# Patient Record
Sex: Female | Born: 1980 | Race: Black or African American | Hispanic: No | Marital: Single | Smoking: Current every day smoker
Health system: Southern US, Community
[De-identification: ages and names within clinical notes are randomized; demographics above are authoritative.]

## PROBLEM LIST (undated history)

## (undated) HISTORY — PX: DILATION AND CURETTAGE OF UTERUS: SHX78

---

## 2015-08-02 ENCOUNTER — Emergency Department
Admission: EM | Admit: 2015-08-02 | Discharge: 2015-08-02 | Disposition: A | Discharge status: Home / Self Care | Payer: No Typology Code available for payment source | Attending: Emergency Medicine | Admitting: Emergency Medicine

## 2015-08-02 ENCOUNTER — Emergency Department: Payer: No Typology Code available for payment source

## 2015-08-02 ENCOUNTER — Encounter: Payer: Self-pay | Admitting: Emergency Medicine

## 2015-08-02 DIAGNOSIS — Z720 Tobacco use: Secondary | ICD-10-CM | POA: Diagnosis not present

## 2015-08-02 DIAGNOSIS — S5012XA Contusion of left forearm, initial encounter: Secondary | ICD-10-CM

## 2015-08-02 DIAGNOSIS — Y998 Other external cause status: Secondary | ICD-10-CM | POA: Diagnosis not present

## 2015-08-02 DIAGNOSIS — S20212A Contusion of left front wall of thorax, initial encounter: Secondary | ICD-10-CM | POA: Diagnosis not present

## 2015-08-02 DIAGNOSIS — S50812A Abrasion of left forearm, initial encounter: Secondary | ICD-10-CM

## 2015-08-02 DIAGNOSIS — S299XXA Unspecified injury of thorax, initial encounter: Secondary | ICD-10-CM | POA: Diagnosis present

## 2015-08-02 DIAGNOSIS — Y92410 Unspecified street and highway as the place of occurrence of the external cause: Secondary | ICD-10-CM | POA: Insufficient documentation

## 2015-08-02 DIAGNOSIS — Y9389 Activity, other specified: Secondary | ICD-10-CM | POA: Insufficient documentation

## 2015-08-02 MED ORDER — CYCLOBENZAPRINE HCL 10 MG PO TABS: 10.0000 mg | ORAL_TABLET | Freq: Three times a day (TID) | ORAL | Status: DC | PRN

## 2015-08-02 MED ORDER — IBUPROFEN 800 MG PO TABS: 800.0000 mg | ORAL_TABLET | Freq: Three times a day (TID) | ORAL | Status: DC | PRN
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
## 2018-09-17 ENCOUNTER — Emergency Department
Admission: EM | Admit: 2018-09-17 | Discharge: 2018-09-17 | Disposition: A | Discharge status: Home / Self Care | Payer: Self-pay | Attending: Emergency Medicine | Admitting: Emergency Medicine

## 2018-09-17 ENCOUNTER — Encounter: Payer: Self-pay | Admitting: Emergency Medicine

## 2018-09-17 ENCOUNTER — Other Ambulatory Visit: Payer: Self-pay

## 2018-09-17 DIAGNOSIS — N3000 Acute cystitis without hematuria: Secondary | ICD-10-CM | POA: Insufficient documentation

## 2018-09-17 DIAGNOSIS — F17210 Nicotine dependence, cigarettes, uncomplicated: Secondary | ICD-10-CM | POA: Insufficient documentation

## 2018-09-17 LAB — URINALYSIS, COMPLETE (UACMP) WITH MICROSCOPIC
Bacteria, UA: NONE SEEN
Bilirubin Urine: NEGATIVE
GLUCOSE, UA: NEGATIVE mg/dL
HGB URINE DIPSTICK: NEGATIVE
Ketones, ur: NEGATIVE mg/dL
NITRITE: NEGATIVE
Protein, ur: NEGATIVE mg/dL
Specific Gravity, Urine: 1.025 (ref 1.005–1.030)
WBC, UA: >50 WBC/hpf — ABNORMAL HIGH (ref 0–5)
pH: 6 (ref 5.0–8.0)

## 2018-09-17 LAB — POCT PREGNANCY, URINE: PREG TEST UR: NEGATIVE

## 2018-09-17 MED ORDER — CEPHALEXIN 500 MG PO CAPS: 500.0000 mg | ORAL_CAPSULE | Freq: Three times a day (TID) | ORAL | 0 refills | Status: AC

## 2018-09-17 NOTE — ED Triage Notes (Signed)
Pt to ED c/o nasal congestion x3 days and also burning with urination x1 month.

## 2018-09-17 NOTE — ED Notes (Signed)
Pt with 5/10 burning with urination. No other complaints.

## 2018-09-17 NOTE — ED Provider Notes (Signed)
[facility] Emergency Department Provider Note  ____________________________________________  Time seen: Approximately 4:18 PM  I have reviewed the triage vital signs and the nursing notes.   HISTORY  Chief Complaint Urinary Tract Infection and Nasal Congestion    HPI Savannah Hughes is a 37 y.o. female presents to the emergency department with 1 month of dysuria without increased urinary frequency, hematuria or low back pain.  Patient reports that she has never had a urinary tract infection in the past.  Patient reports that she is not currently sexually active and has no concerns for STDs.  No changes in vaginal discharge.  Patient denies a prior history of nephrolithiasis.  Patient reports that she is scared of coming to hospitals and that is why she waited so long before seeking treatment.  No fever at home.  No nausea or vomiting.  Patient secondarily reports some intermittent nasal congestion.   History reviewed. No pertinent past medical history.  There are no active problems to display for this patient.   History reviewed. No pertinent surgical history.  Prior to Admission medications   Medication Sig Start Date End Date Taking? Authorizing Provider  cephALEXin (KEFLEX) 500 MG capsule Take 1 capsule (500 mg total) by mouth 3 (three) times daily for 7 days. 09/17/18 09/24/18  Orvil Feil, PA-C  cyclobenzaprine (FLEXERIL) 10 MG tablet Take 1 tablet (10 mg total) by mouth every 8 (eight) hours as needed for muscle spasms. 08/02/15   Joni Reining, PA-C  ibuprofen (ADVIL,MOTRIN) 800 MG tablet Take 1 tablet (800 mg total) by mouth every 8 (eight) hours as needed for moderate pain. 08/02/15   Joni Reining, PA-C    Allergies Patient has no known allergies.  History reviewed. No pertinent family history.  Social History Social History   Tobacco Use  . Smoking status: Current Every Day Smoker    Packs/day: 1.00    Types: Cigarettes  .  Smokeless tobacco: Never Used  Substance Use Topics  . Alcohol use: Yes    Frequency: Never    Comment: 4-5 25oz beers per day  . Drug use: Yes    Types: Marijuana     Review of Systems  Constitutional: No fever/chills Eyes: No visual changes. No discharge ENT: No upper respiratory complaints. Cardiovascular: no chest pain. Respiratory: no cough. No SOB. Gastrointestinal: No abdominal pain.  No nausea, no vomiting.  No diarrhea.  No constipation. Genitourinary: Patient has dysuria. No hematuria Musculoskeletal: Negative for musculoskeletal pain. Skin: Negative for rash, abrasions, lacerations, ecchymosis. Neurological: Negative for headaches, focal weakness or numbness.   ____________________________________________   PHYSICAL EXAM:  VITAL SIGNS: ED Triage Vitals [09/17/18 1524]  Enc Vitals Group     BP (!) 135/93     Pulse Rate 94     Resp 14     Temp 98.6 F (37 C)     Temp Source Oral     SpO2 100 %     Weight 180 lb (81.6 kg)     Height 5\' 1"  (1.549 m)     Head Circumference      Peak Flow      Pain Score 5     Pain Loc      Pain Edu?      Excl. in GC?      Constitutional: Alert and oriented. Well appearing and in no acute distress. Eyes: Conjunctivae are normal. PERRL. EOMI. Head: Atraumatic. ENT:      Ears: TMs are pearly.  Nose: No congestion/rhinnorhea.      Mouth/Throat: Mucous membranes are moist.  Neck: No stridor.  No cervical spine tenderness to palpation. Hematological/Lymphatic/Immunilogical: No cervical lymphadenopathy. Cardiovascular: Normal rate, regular rhythm. Normal S1 and S2.  Good peripheral circulation. Respiratory: Normal respiratory effort without tachypnea or retractions. Lungs CTAB. Good air entry to the bases with no decreased or absent breath sounds. Gastrointestinal: Bowel sounds 4 quadrants. Soft and nontender to palpation. No guarding or rigidity. No palpable masses. No distention. No CVA tenderness. Musculoskeletal:  Full range of motion to all extremities. No gross deformities appreciated. Neurologic:  Normal speech and language. No gross focal neurologic deficits are appreciated.  Skin:  Skin is warm, dry and intact. No rash noted. Psychiatric: Mood and affect are normal. Speech and behavior are normal. Patient exhibits appropriate insight and judgement.   ____________________________________________   LABS (all labs ordered are listed, but only abnormal results are displayed)  Labs Reviewed  URINALYSIS, COMPLETE (UACMP) WITH MICROSCOPIC - Abnormal; Notable for the following components:      Result Value   Color, Urine YELLOW (*)    APPearance HAZY (*)    Leukocytes, UA LARGE (*)    WBC, UA >50 (*)    All other components within normal limits  POCT PREGNANCY, URINE  POC URINE PREG, ED   ____________________________________________  EKG   ____________________________________________  RADIOLOGY   No results found.  ____________________________________________    PROCEDURES  Procedure(s) performed:    Procedures    Medications - No data to display   ____________________________________________   INITIAL IMPRESSION / ASSESSMENT AND PLAN / ED COURSE  Pertinent labs & imaging results that were available during my care of the patient were reviewed by me and considered in my medical decision making (see chart for details).  Review of the [place] CSRS was performed in accordance of the [facility] prior to dispensing any controlled drugs.  Clinical Course as of Sep 17 1617  Tue Sep 17, 2018  1545 Preg Test, Ur: NEGATIVE [JW]    Clinical Course User Index [JW] Orvil Feil, PA-C     Assessment and plan:  Cystitis Patient presents to the emergency department with dysuria for 1 month.  Differential diagnosis included cystitis, pyelonephritis and STD.  Urinalysis was concerning for cystitis with a large amount of leukocytes.  Patient denies a history of prior pyelonephritis and  has not had back pain, nausea or fever, decreasing suspicion for pyelonephritis.  Patient denies current sexual or recent sexual activity, decreasing suspicion for STDs.  Patient was treated empirically with Keflex.  She was advised to follow-up with primary care as needed.  All patient questions were answered.   ____________________________________________  FINAL CLINICAL IMPRESSION(S) / ED DIAGNOSES  Final diagnoses:  Acute cystitis without hematuria      NEW MEDICATIONS STARTED DURING THIS VISIT:  ED Discharge Orders         Ordered    cephALEXin (KEFLEX) 500 MG capsule  3 times daily     09/17/18 1615              This chart was dictated using voice recognition software/Dragon. Despite best efforts to proofread, errors can occur which can change the meaning. Any change was purely unintentional.    Orvil Feil, PA-C 09/17/18 1624    Rockne Menghini, MD 09/18/18 [phone]

## 2021-01-25 ENCOUNTER — Emergency Department: Payer: No Typology Code available for payment source

## 2021-01-25 ENCOUNTER — Emergency Department
Admission: EM | Admit: 2021-01-25 | Discharge: 2021-01-25 | Disposition: A | Discharge status: Home / Self Care | Payer: No Typology Code available for payment source | Attending: Emergency Medicine | Admitting: Emergency Medicine

## 2021-01-25 ENCOUNTER — Other Ambulatory Visit: Payer: Self-pay

## 2021-01-25 DIAGNOSIS — M542 Cervicalgia: Secondary | ICD-10-CM | POA: Diagnosis not present

## 2021-01-25 DIAGNOSIS — F17210 Nicotine dependence, cigarettes, uncomplicated: Secondary | ICD-10-CM | POA: Insufficient documentation

## 2021-01-25 DIAGNOSIS — M25562 Pain in left knee: Secondary | ICD-10-CM | POA: Diagnosis not present

## 2021-01-25 DIAGNOSIS — Y92410 Unspecified street and highway as the place of occurrence of the external cause: Secondary | ICD-10-CM | POA: Insufficient documentation

## 2021-01-25 DIAGNOSIS — R0789 Other chest pain: Secondary | ICD-10-CM | POA: Diagnosis not present

## 2021-01-25 MED ORDER — MELOXICAM 15 MG PO TABS: 15.0000 mg | ORAL_TABLET | Freq: Every day | ORAL | 2 refills | Status: AC
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
## 2021-01-25 NOTE — Discharge Instructions (Signed)
Take Meloxicam once daily for pain and inflammation.  

## 2021-02-05 ENCOUNTER — Other Ambulatory Visit: Payer: Self-pay

## 2021-02-05 ENCOUNTER — Encounter: Payer: Self-pay | Admitting: Emergency Medicine

## 2021-02-05 ENCOUNTER — Emergency Department
Admission: EM | Admit: 2021-02-05 | Discharge: 2021-02-05 | Disposition: A | Discharge status: Home / Self Care | Payer: No Typology Code available for payment source | Attending: Emergency Medicine | Admitting: Emergency Medicine

## 2021-02-05 DIAGNOSIS — Q761 Klippel-Feil syndrome: Secondary | ICD-10-CM | POA: Insufficient documentation

## 2021-02-05 DIAGNOSIS — M25511 Pain in right shoulder: Secondary | ICD-10-CM | POA: Insufficient documentation

## 2021-02-05 DIAGNOSIS — F17210 Nicotine dependence, cigarettes, uncomplicated: Secondary | ICD-10-CM | POA: Diagnosis not present

## 2021-02-05 DIAGNOSIS — M542 Cervicalgia: Secondary | ICD-10-CM | POA: Insufficient documentation

## 2021-02-05 DIAGNOSIS — Y92410 Unspecified street and highway as the place of occurrence of the external cause: Secondary | ICD-10-CM | POA: Diagnosis not present

## 2021-02-05 MED ORDER — NAPROXEN 500 MG PO TABS: 500.0000 mg | ORAL_TABLET | Freq: Two times a day (BID) | ORAL | 0 refills | Status: AC

## 2021-02-05 MED ORDER — ORPHENADRINE CITRATE ER 100 MG PO TB12: 100.0000 mg | ORAL_TABLET | Freq: Two times a day (BID) | ORAL | 0 refills | Status: AC

## 2021-02-05 MED ORDER — LIDOCAINE 5 % EX PTCH
1.0000 | MEDICATED_PATCH | CUTANEOUS | Status: DC
  Administered 2021-02-05: 1 via TRANSDERMAL
  Filled 2021-02-05: qty 1
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
## 2021-02-05 NOTE — Discharge Instructions (Addendum)
Follow discharge care instruction take medication as directed.  Follow-up with orthopedics for definitive evaluation and treatment.  Take medication as directed.  Be advised muscle relaxants may cause drowsiness.
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
